# Patient Record
Sex: Female | Born: 1975 | Race: Black or African American | Hispanic: No | Marital: Single | Smoking: Never smoker
Health system: Southern US, Community
[De-identification: ages and names within clinical notes are randomized; demographics above are authoritative.]

## PROBLEM LIST (undated history)

## (undated) DIAGNOSIS — E079 Disorder of thyroid, unspecified: Secondary | ICD-10-CM

## (undated) DIAGNOSIS — I10 Essential (primary) hypertension: Secondary | ICD-10-CM

## (undated) DIAGNOSIS — E0590 Thyrotoxicosis, unspecified without thyrotoxic crisis or storm: Secondary | ICD-10-CM

## (undated) HISTORY — DX: Thyrotoxicosis, unspecified without thyrotoxic crisis or storm: E05.90

---

## 2012-10-31 ENCOUNTER — Encounter (HOSPITAL_BASED_OUTPATIENT_CLINIC_OR_DEPARTMENT_OTHER): Payer: Self-pay | Admitting: Emergency Medicine

## 2012-10-31 ENCOUNTER — Emergency Department (HOSPITAL_BASED_OUTPATIENT_CLINIC_OR_DEPARTMENT_OTHER)
Admission: EM | Admit: 2012-10-31 | Discharge: 2012-11-01 | Disposition: A | Discharge status: Home / Self Care | Payer: PRIVATE HEALTH INSURANCE | Attending: Emergency Medicine | Admitting: Emergency Medicine

## 2012-10-31 ENCOUNTER — Emergency Department (HOSPITAL_BASED_OUTPATIENT_CLINIC_OR_DEPARTMENT_OTHER): Payer: PRIVATE HEALTH INSURANCE

## 2012-10-31 DIAGNOSIS — B9689 Other specified bacterial agents as the cause of diseases classified elsewhere: Secondary | ICD-10-CM

## 2012-10-31 DIAGNOSIS — R109 Unspecified abdominal pain: Secondary | ICD-10-CM | POA: Insufficient documentation

## 2012-10-31 DIAGNOSIS — O2390 Unspecified genitourinary tract infection in pregnancy, unspecified trimester: Secondary | ICD-10-CM | POA: Insufficient documentation

## 2012-10-31 DIAGNOSIS — N898 Other specified noninflammatory disorders of vagina: Secondary | ICD-10-CM | POA: Insufficient documentation

## 2012-10-31 DIAGNOSIS — N760 Acute vaginitis: Secondary | ICD-10-CM | POA: Insufficient documentation

## 2012-10-31 DIAGNOSIS — O9989 Other specified diseases and conditions complicating pregnancy, childbirth and the puerperium: Secondary | ICD-10-CM | POA: Insufficient documentation

## 2012-10-31 DIAGNOSIS — O200 Threatened abortion: Secondary | ICD-10-CM | POA: Insufficient documentation

## 2012-10-31 LAB — WET PREP, GENITAL

## 2012-10-31 LAB — PREGNANCY, URINE: Preg Test, Ur: POSITIVE — AB

## 2012-10-31 LAB — URINALYSIS, ROUTINE W REFLEX MICROSCOPIC
Bilirubin Urine: NEGATIVE
Ketones, ur: 15 mg/dL — AB
Nitrite: NEGATIVE
Protein, ur: NEGATIVE mg/dL
Urobilinogen, UA: 1 mg/dL (ref 0.0–1.0)
pH: 6 (ref 5.0–8.0)

## 2012-10-31 LAB — RH IG WORKUP (INCLUDES ABO/RH): ABO/RH(D): O POS

## 2012-10-31 MED ORDER — METRONIDAZOLE 500 MG PO TABS: 500.0000 mg | ORAL_TABLET | Freq: Two times a day (BID) | ORAL | Status: DC

## 2012-10-31 NOTE — ED Provider Notes (Signed)
CSN: [id number]     Arrival date & time 10/31/12  2051 History   First MD Initiated Contact with Patient 10/31/12 2232     Chief Complaint  Patient presents with  . Vaginal Bleeding   (Consider location/radiation/quality/duration/timing/severity/associated sxs/prior Treatment) Patient is a 37 y.o. female presenting with vaginal bleeding. The history is provided by the patient.  Vaginal Bleeding Quality:  Bright red Severity:  Mild Onset quality:  Sudden Timing: once. Chronicity:  New Possible pregnancy: yes   Context: after urination   Relieved by:  None tried Associated symptoms: abdominal pain (left lower pain sincer earlier today) and vaginal discharge   Associated symptoms: no dizziness, no dysuria, no fever and no nausea   Risk factors: no hx of ectopic pregnancy, no new sexual partner and no STD exposure     History reviewed. No pertinent past medical history. History reviewed. No pertinent past surgical history. No family history on file. History  Substance Use Topics  . Smoking status: Never Smoker   . Smokeless tobacco: Not on file  . Alcohol Use: No   OB History   Grav Para Term Preterm Abortions TAB SAB Ect Mult Living   1              Review of Systems  Constitutional: Negative for fever.  Respiratory: Negative for shortness of breath.   Cardiovascular: Negative for chest pain.  Gastrointestinal: Positive for abdominal pain (left lower pain sincer earlier today). Negative for nausea and vomiting.  Genitourinary: Positive for vaginal bleeding and vaginal discharge. Negative for dysuria, hematuria and pelvic pain.  Neurological: Negative for dizziness and light-headedness.  All other systems reviewed and are negative.    Allergies  Review of patient's allergies indicates no known allergies.  Home Medications   Current Outpatient Rx  Name  Route  Sig  Dispense  Refill  . Prenatal Vit-Fe Fumarate-FA (PRENATAL MULTIVITAMIN) TABS tablet   Oral   Take 1  tablet by mouth daily at 12 noon.          BP 114/56  Pulse 75  Temp(Src) 97.7 F (36.5 C) (Oral)  Resp 16  Ht 5' 10.5" (1.791 m)  Wt 160 lb (72.576 kg)  BMI 22.63 kg/m2  SpO2 100%  LMP 07/22/2012 Physical Exam  Nursing note and vitals reviewed. Constitutional: She is oriented to person, place, and time. She appears well-developed and well-nourished.  HENT:  Head: Normocephalic and atraumatic.  Right Ear: External ear normal.  Left Ear: External ear normal.  Nose: Nose normal.  Eyes: Right eye exhibits no discharge. Left eye exhibits no discharge.  Cardiovascular: Normal rate, regular rhythm and normal heart sounds.   Pulmonary/Chest: Effort normal and breath sounds normal.  Abdominal: Soft. There is tenderness (mild) in the suprapubic area and left lower quadrant.  Genitourinary: Uterus is tender (mild). Cervix exhibits no motion tenderness. Vaginal discharge found.  Cervical os is closed  Neurological: She is alert and oriented to person, place, and time.  Skin: Skin is warm and dry.    ED Course  Procedures (including critical care time) Labs Review Labs Reviewed  WET PREP, GENITAL - Abnormal; Notable for the following:    Clue Cells Wet Prep HPF POC MODERATE (*)    WBC, Wet Prep HPF POC FEW (*)    All other components within normal limits  PREGNANCY, URINE - Abnormal; Notable for the following:    Preg Test, Ur POSITIVE (*)    All other components within normal limits  URINALYSIS,  ROUTINE W REFLEX MICROSCOPIC - Abnormal; Notable for the following:    Ketones, ur 15 (*)    All other components within normal limits  HCG, QUANTITATIVE, PREGNANCY - Abnormal; Notable for the following:    hCG, Beta Chain, Quant, S 29562 (*)    All other components within normal limits  GC/CHLAMYDIA PROBE AMP  RH IG WORKUP (INCLUDES ABO/RH)   Imaging Review US Ob Comp Less 14 Wks  10/31/2012   *RADIOLOGY REPORT*  Clinical Data: Cramping and spotting.  OBSTETRIC <14 WK ULTRASOUND   Technique:  Transabdominal ultrasound was performed for evaluation of the gestation as well as the maternal uterus and adnexal regions.  Comparison:  None.  Intrauterine gestational sac: Single and within normal. Yolk sac: Not visualized. Embryo: Within normal Cardiac Activity: Within normal Heart Rate: 136 bpm  Normal subjective amount of amniotic fluid volume.  CRL:  80.9 mm  14 w  zero d         [place] EDC: 05/01/2013  Maternal uterus/Adnexae: Ovaries not visualized.  No free fluid.  IMPRESSION: Single live IUP with estimated gestational age [redacted] weeks 0 days.   Original Report Authenticated By: Elberta Fortis, M.D.    MDM   1. Threatened abortion   2. BV (bacterial vaginosis)    37 year old female who is a G5P3 with some bleeding after wiping today after urination. She denies any other vaginal bleeding. She's had some mild lower abdominal discomfort. She is approximately [redacted] weeks pregnant by LMP. US shows viable fetus, no ectopic. Blood type is O+, no rhogam necessary. Will treat symptomatic BV with flagyl and recommend close f/u with OB.    Audree Camel, MD 10/31/12 [phone]

## 2012-10-31 NOTE — ED Notes (Signed)
MD at bedside. 

## 2012-10-31 NOTE — ED Notes (Signed)
Pt has not had any prenatal care up to this point

## 2012-10-31 NOTE — ED Notes (Signed)
Pt c/o abd cramping and spotting bright red blood, described as small amount by pt. Pt is [redacted] weeks pregnant.

## 2012-10-31 NOTE — ED Notes (Signed)
Patient transported to Ultrasound 

## 2012-11-02 LAB — GC/CHLAMYDIA PROBE AMP: GC Probe RNA: NEGATIVE

## 2014-01-07 ENCOUNTER — Encounter (HOSPITAL_BASED_OUTPATIENT_CLINIC_OR_DEPARTMENT_OTHER): Payer: Self-pay | Admitting: Emergency Medicine

## 2015-08-12 ENCOUNTER — Emergency Department (HOSPITAL_BASED_OUTPATIENT_CLINIC_OR_DEPARTMENT_OTHER): Payer: No Typology Code available for payment source

## 2015-08-12 ENCOUNTER — Encounter (HOSPITAL_BASED_OUTPATIENT_CLINIC_OR_DEPARTMENT_OTHER): Payer: Self-pay | Admitting: *Deleted

## 2015-08-12 ENCOUNTER — Emergency Department (HOSPITAL_BASED_OUTPATIENT_CLINIC_OR_DEPARTMENT_OTHER)
Admission: EM | Admit: 2015-08-12 | Discharge: 2015-08-12 | Disposition: A | Discharge status: Home / Self Care | Payer: No Typology Code available for payment source | Attending: Emergency Medicine | Admitting: Emergency Medicine

## 2015-08-12 DIAGNOSIS — Z79899 Other long term (current) drug therapy: Secondary | ICD-10-CM | POA: Diagnosis not present

## 2015-08-12 DIAGNOSIS — M545 Low back pain, unspecified: Secondary | ICD-10-CM

## 2015-08-12 DIAGNOSIS — M546 Pain in thoracic spine: Secondary | ICD-10-CM | POA: Diagnosis not present

## 2015-08-12 DIAGNOSIS — Y9389 Activity, other specified: Secondary | ICD-10-CM | POA: Insufficient documentation

## 2015-08-12 DIAGNOSIS — I10 Essential (primary) hypertension: Secondary | ICD-10-CM | POA: Diagnosis not present

## 2015-08-12 DIAGNOSIS — Y92410 Unspecified street and highway as the place of occurrence of the external cause: Secondary | ICD-10-CM | POA: Insufficient documentation

## 2015-08-12 DIAGNOSIS — Y999 Unspecified external cause status: Secondary | ICD-10-CM | POA: Insufficient documentation

## 2015-08-12 HISTORY — DX: Disorder of thyroid, unspecified: E07.9

## 2015-08-12 HISTORY — DX: Essential (primary) hypertension: I10

## 2015-08-12 LAB — PREGNANCY, URINE: Preg Test, Ur: NEGATIVE

## 2015-08-12 MED ORDER — NAPROXEN 250 MG PO TABS
500.0000 mg | ORAL_TABLET | Freq: Once | ORAL | Status: AC
  Administered 2015-08-12: 500 mg via ORAL
  Filled 2015-08-12: qty 2
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
## 2019-12-06 ENCOUNTER — Other Ambulatory Visit: Payer: Self-pay

## 2019-12-06 ENCOUNTER — Ambulatory Visit (INDEPENDENT_AMBULATORY_CARE_PROVIDER_SITE_OTHER): Payer: No Typology Code available for payment source | Admitting: Family Medicine

## 2019-12-06 ENCOUNTER — Other Ambulatory Visit (HOSPITAL_COMMUNITY)
Admission: RE | Admit: 2019-12-06 | Discharge: 2019-12-06 | Disposition: A | Discharge status: Home / Self Care | Payer: No Typology Code available for payment source | Source: Ambulatory Visit | Attending: Family Medicine | Admitting: Family Medicine

## 2019-12-06 ENCOUNTER — Encounter: Payer: Self-pay | Admitting: Family Medicine

## 2019-12-06 VITALS — BP 101/68 | HR 58 | Ht 70.0 in | Wt 130.0 lb

## 2019-12-06 DIAGNOSIS — Z1231 Encounter for screening mammogram for malignant neoplasm of breast: Secondary | ICD-10-CM | POA: Diagnosis not present

## 2019-12-06 DIAGNOSIS — Z01419 Encounter for gynecological examination (general) (routine) without abnormal findings: Secondary | ICD-10-CM

## 2019-12-06 DIAGNOSIS — Z113 Encounter for screening for infections with a predominantly sexual mode of transmission: Secondary | ICD-10-CM | POA: Diagnosis not present

## 2019-12-06 DIAGNOSIS — Z0000 Encounter for general adult medical examination without abnormal findings: Secondary | ICD-10-CM | POA: Diagnosis not present

## 2019-12-06 DIAGNOSIS — Z308 Encounter for other contraceptive management: Secondary | ICD-10-CM | POA: Diagnosis not present

## 2019-12-06 NOTE — Progress Notes (Signed)
GYNECOLOGY ANNUAL PREVENTATIVE CARE ENCOUNTER NOTE  Subjective:   Cassandra Harper is a 44 y.o. [phone] female here for a routine annual gynecologic exam.  Current complaints: none.   Denies abnormal vaginal bleeding, discharge, pelvic pain, problems with intercourse or other gynecologic concerns.    Gynecologic History Patient's last menstrual period was 10/21/2019. Patient is  sexually active  Contraception: condoms Last Pap: 3 years ago. Results were: normal  Obstetric History OB History  Gravida Para Term Preterm AB Living  7 4 4   3 4   SAB TAB Ectopic Multiple Live Births  1 2     4     # Outcome Date GA Lbr Len/2nd Weight Sex Delivery Anes PTL Lv  7 Term 45 [redacted]w[redacted]d   F Vag-Spont None N LIV  6 TAB 2014          5 Term 2007 [redacted]w[redacted]d   M Vag-Spont None N LIV  4 TAB 2006          3 Term 2002 [redacted]w[redacted]d   F Vag-Spont None N LIV  2 Term 1998 [redacted]w[redacted]d   F Vag-Spont EPI N LIV  1 SAB 1997            Past Medical History:  Diagnosis Date   Hypertension    Hyperthyroidism    Thyroid disease     History reviewed. No pertinent surgical history.  Current Outpatient Medications on File Prior to Visit  Medication Sig Dispense Refill   methimazole (TAPAZOLE) 10 MG tablet Take by mouth every other day. (Patient not taking: Reported on 12/06/2019)     PROPRANOLOL HCL PO Take by mouth every other day. (Patient not taking: Reported on 12/06/2019)     No current facility-administered medications on file prior to visit.    No Known Allergies  Social History   Socioeconomic History   Marital status: Single    Spouse name: Not on file   Number of children: Not on file   Years of education: Not on file   Highest education level: Not on file  Occupational History   Not on file  Tobacco Use   Smoking status: Never Smoker   Smokeless tobacco: Never Used  Substance and Sexual Activity   Alcohol use: No   Drug use: Never   Sexual activity: Yes    Birth control/protection:  None  Other Topics Concern   Not on file  Social History Narrative   Not on file   Social Determinants of Health   Financial Resource Strain:    Difficulty of Paying Living Expenses: Not on file  Food Insecurity:    Worried About Running Out of Food in the Last Year: Not on file   Ran Out of Food in the Last Year: Not on file  Transportation Needs:    Lack of Transportation (Medical): Not on file   Lack of Transportation (Non-Medical): Not on file  Physical Activity:    Days of Exercise per Week: Not on file   Minutes of Exercise per Session: Not on file  Stress:    Feeling of Stress : Not on file  Social Connections:    Frequency of Communication with Friends and Family: Not on file   Frequency of Social Gatherings with Friends and Family: Not on file   Attends Religious Services: Not on file   Active Member of Clubs or Organizations: Not on file   Attends 12/08/2019 Meetings: Not on file   Marital Status: Not on file  Intimate Partner Violence:    Fear of Current or Ex-Partner: Not on file   Emotionally Abused: Not on file   Physically Abused: Not on file   Sexually Abused: Not on file    Family History  Problem Relation Age of Onset   Cancer Father     The following portions of the patient's history were reviewed and updated as appropriate: allergies, current medications, past family history, past medical history, past social history, past surgical history and problem list.  Review of Systems Pertinent items are noted in HPI.   Objective:  BP 101/68    Pulse (!) 58    Ht 5\' 10"  (1.778 m)    Wt 130 lb (59 kg)    LMP 10/21/2019    BMI 18.65 kg/m  Wt Readings from Last 3 Encounters:  12/06/19 130 lb (59 kg)  08/12/15 167 lb (75.8 kg)  10/31/12 160 lb (72.6 kg)     Chaperone present during exam  CONSTITUTIONAL: Well-developed, well-nourished female in no acute distress.  HENT:  Normocephalic, atraumatic, External right and left  ear normal. Oropharynx is clear and moist EYES: Conjunctivae and EOM are normal. Pupils are equal, round, and reactive to light. No scleral icterus.  NECK: Normal range of motion, supple, no masses.  Normal thyroid.   CARDIOVASCULAR: Normal heart rate noted, regular rhythm RESPIRATORY: Clear to auscultation bilaterally. Effort and breath sounds normal, no problems with respiration noted. BREASTS: Symmetric in size. No masses, skin changes, nipple drainage, or lymphadenopathy. ABDOMEN: Soft, normal bowel sounds, no distention noted.  No tenderness, rebound or guarding.  PELVIC: Normal appearing external genitalia; normal appearing vaginal mucosa and cervix.  No abnormal discharge noted.   MUSCULOSKELETAL: Normal range of motion. No tenderness.  No cyanosis, clubbing, or edema.  2+ distal pulses. SKIN: Skin is warm and dry. No rash noted. Not diaphoretic. No erythema. No pallor. NEUROLOGIC: Alert and oriented to person, place, and time. Normal reflexes, muscle tone coordination. No cranial nerve deficit noted. PSYCHIATRIC: Normal mood and affect. Normal behavior. Normal judgment and thought content.  Assessment:  Annual gynecologic examination with pap smear   Plan:  1. Well Woman Exam Will follow up results of pap smear and manage accordingly. Mammogram scheduled STD testing discussed. Patient requested testing - Cytology - PAP( [place]) - RPR - Hepatitis B Surface AntiGEN - Hepatitis C Antibody - HIV antibody (with reflex)  2. Encounter for screening mammogram for malignant neoplasm of breast - MM DIGITAL SCREENING BILATERAL; Future  3. Screening for STD (sexually transmitted disease) - [facility]PR - Hepatitis B Surface AntiGEN - Hepatitis C Antibody - HIV antibody (with reflex)   Routine preventative health maintenance measures emphasized. Please refer to After Visit Summary for other counseling recommendations.    11/02/12, DO [facility]

## 2019-12-07 LAB — HEPATITIS B SURFACE ANTIGEN: Hepatitis B Surface Ag: NEGATIVE

## 2019-12-07 LAB — RPR: RPR Ser Ql: NONREACTIVE

## 2019-12-07 LAB — HIV ANTIBODY (ROUTINE TESTING W REFLEX): HIV Screen 4th Generation wRfx: NONREACTIVE

## 2019-12-07 LAB — HEPATITIS C ANTIBODY: Hep C Virus Ab: <0.1 s/co ratio (ref 0.0–0.9)

## 2019-12-11 LAB — CYTOLOGY - PAP
Chlamydia: NEGATIVE
Comment: NEGATIVE
Comment: NEGATIVE
Comment: NORMAL
Diagnosis: NEGATIVE
High risk HPV: NEGATIVE
Neisseria Gonorrhea: NEGATIVE

## 2020-01-08 ENCOUNTER — Ambulatory Visit (HOSPITAL_BASED_OUTPATIENT_CLINIC_OR_DEPARTMENT_OTHER)
Admission: RE | Admit: 2020-01-08 | Discharge: 2020-01-08 | Disposition: A | Discharge status: Home / Self Care | Payer: No Typology Code available for payment source | Source: Ambulatory Visit | Attending: Family Medicine | Admitting: Family Medicine

## 2020-01-08 ENCOUNTER — Other Ambulatory Visit: Payer: Self-pay

## 2020-01-08 ENCOUNTER — Ambulatory Visit (INDEPENDENT_AMBULATORY_CARE_PROVIDER_SITE_OTHER): Payer: No Typology Code available for payment source | Admitting: Medical

## 2020-01-08 ENCOUNTER — Encounter: Payer: Self-pay | Admitting: Medical

## 2020-01-08 ENCOUNTER — Encounter (HOSPITAL_BASED_OUTPATIENT_CLINIC_OR_DEPARTMENT_OTHER): Payer: Self-pay

## 2020-01-08 VITALS — BP 104/69 | HR 57 | Resp 18 | Ht 69.0 in | Wt 129.6 lb

## 2020-01-08 DIAGNOSIS — R634 Abnormal weight loss: Secondary | ICD-10-CM

## 2020-01-08 DIAGNOSIS — R630 Anorexia: Secondary | ICD-10-CM | POA: Diagnosis not present

## 2020-01-08 DIAGNOSIS — Z0000 Encounter for general adult medical examination without abnormal findings: Secondary | ICD-10-CM

## 2020-01-08 DIAGNOSIS — Z1231 Encounter for screening mammogram for malignant neoplasm of breast: Secondary | ICD-10-CM | POA: Insufficient documentation

## 2020-01-08 DIAGNOSIS — Z0001 Encounter for general adult medical examination with abnormal findings: Secondary | ICD-10-CM

## 2020-01-08 DIAGNOSIS — R350 Frequency of micturition: Secondary | ICD-10-CM

## 2020-01-08 DIAGNOSIS — Z8639 Personal history of other endocrine, nutritional and metabolic disease: Secondary | ICD-10-CM

## 2020-01-08 DIAGNOSIS — F32A Depression, unspecified: Secondary | ICD-10-CM

## 2020-01-08 MED ORDER — SERTRALINE HCL 25 MG PO TABS: 25.0000 mg | ORAL_TABLET | Freq: Every day | ORAL | 0 refills | Status: DC

## 2020-01-08 NOTE — Patient Instructions (Addendum)
For you wellness exam today I have ordered cbc, cmp and lipid.  Check tsh and t4 on day of lab work.  For weight loss follow mammogram. May refer you to gi but want you do stool cards for blood test and return those to our office.  For depression rx sertraline. Will see if you gain weight on med. May refer to [facility] for early screening colon ca.  May consider ct scan abd pelvis on follow up.  Get ua poct when not on menses.  Declined flu vaccine.  Recommend exercise and healthy diet.  We will let you know lab results as they come in.  Follow up date appointment will be determined after lab review.    Preventive Care 20-87 Years Old, Female Preventive care refers to visits with your health care provider and lifestyle choices that can promote health and wellness. This includes:  A yearly physical exam. This may also be called an annual well check.  Regular dental visits and eye exams.  Immunizations.  Screening for certain conditions.  Healthy lifestyle choices, such as eating a healthy diet, getting regular exercise, not using drugs or products that contain nicotine and tobacco, and limiting alcohol use. What can I expect for my preventive care visit? Physical exam Your health care provider will check your:  Height and weight. This may be used to calculate body mass index (BMI), which tells if you are at a healthy weight.  Heart rate and blood pressure.  Skin for abnormal spots. Counseling Your health care provider may ask you questions about your:  Alcohol, tobacco, and drug use.  Emotional well-being.  Home and relationship well-being.  Sexual activity.  Eating habits.  Work and work Statistician.  Method of birth control.  Menstrual cycle.  Pregnancy history. What immunizations do I need?  Influenza (flu) vaccine  This is recommended every year. Tetanus, diphtheria, and pertussis (Tdap) vaccine  You may need a Td booster every 10 years. Varicella  (chickenpox) vaccine  You may need this if you have not been vaccinated. Zoster (shingles) vaccine  You may need this after age 89. Measles, mumps, and rubella (MMR) vaccine  You may need at least one dose of MMR if you were born in 1957 or later. You may also need a second dose. Pneumococcal conjugate (PCV13) vaccine  You may need this if you have certain conditions and were not previously vaccinated. Pneumococcal polysaccharide (PPSV23) vaccine  You may need one or two doses if you smoke cigarettes or if you have certain conditions. Meningococcal conjugate (MenACWY) vaccine  You may need this if you have certain conditions. Hepatitis A vaccine  You may need this if you have certain conditions or if you travel or work in places where you may be exposed to hepatitis A. Hepatitis B vaccine  You may need this if you have certain conditions or if you travel or work in places where you may be exposed to hepatitis B. Haemophilus influenzae type b (Hib) vaccine  You may need this if you have certain conditions. Human papillomavirus (HPV) vaccine  If recommended by your health care provider, you may need three doses over 6 months. You may receive vaccines as individual doses or as more than one vaccine together in one shot (combination vaccines). Talk with your health care provider about the risks and benefits of combination vaccines. What tests do I need? Blood tests  Lipid and cholesterol levels. These may be checked every 5 years, or more frequently if you are  over 98 years old.  Hepatitis C test.  Hepatitis B test. Screening  Lung cancer screening. You may have this screening every year starting at age 38 if you have a 30-pack-year history of smoking and currently smoke or have quit within the past 15 years.  Colorectal cancer screening. All adults should have this screening starting at age 56 and continuing until age 51. Your health care provider may recommend screening at  age 50 if you are at increased risk. You will have tests every 1-10 years, depending on your results and the type of screening test.  Diabetes screening. This is done by checking your blood sugar (glucose) after you have not eaten for a while (fasting). You may have this done every 1-3 years.  Mammogram. This may be done every 1-2 years. Talk with your health care provider about when you should start having regular mammograms. This may depend on whether you have a family history of breast cancer.  BRCA-related cancer screening. This may be done if you have a family history of breast, ovarian, tubal, or peritoneal cancers.  Pelvic exam and Pap test. This may be done every 3 years starting at age 57. Starting at age 15, this may be done every 5 years if you have a Pap test in combination with an HPV test. Other tests  Sexually transmitted disease (STD) testing.  Bone density scan. This is done to screen for osteoporosis. You may have this scan if you are at high risk for osteoporosis. Follow these instructions at home: Eating and drinking  Eat a diet that includes fresh fruits and vegetables, whole grains, lean protein, and low-fat dairy.  Take vitamin and mineral supplements as recommended by your health care provider.  Do not drink alcohol if: ? Your health care provider tells you not to drink. ? You are pregnant, may be pregnant, or are planning to become pregnant.  If you drink alcohol: ? Limit how much you have to 0-1 drink a day. ? Be aware of how much alcohol is in your drink. In the [place] one drink equals one 12 oz bottle of beer (355 mL), one 5 oz glass of wine (148 mL), or one 1 oz glass of hard liquor (44 mL). Lifestyle  Take daily care of your teeth and gums.  Stay active. Exercise for at least 30 minutes on 5 or more days each week.  Do not use any products that contain nicotine or tobacco, such as cigarettes, e-cigarettes, and chewing tobacco. If you need help quitting,  ask your health care provider.  If you are sexually active, practice safe sex. Use a condom or other form of birth control (contraception) in order to prevent pregnancy and STIs (sexually transmitted infections).  If told by your health care provider, take low-dose aspirin daily starting at age 53. What's next?  Visit your health care provider once a year for a well check visit.  Ask your health care provider how often you should have your eyes and teeth checked.  Stay up to date on all vaccines. This information is not intended to replace advice given to you by your health care provider. Make sure you discuss any questions you have with your health care provider. Document Revised: 11/03/2017 Document Reviewed: 11/03/2017 Elsevier Patient Education  2020 [employer].

## 2020-01-08 NOTE — Progress Notes (Addendum)
Subjective:    Patient ID: Cassandra Harper, female    DOB: [birth year], 44 y.o.   MRN: [id number]  HPI  Pt in for first time.  Pt was seen in past [facility]. Pt does not exercise regularly. She like to walk. Pt is member of [employer]. Nonsmoker. Used to smoke but quit 10 years ago. No alcohol.  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   Pt thinks not eating healthy. Eats small amounts.   Pt has history of hyperthyroidism. Pt used to be on methimazole and propanolol. Pt being followed endocrinologist. Pt seeing [facility].  Pt states endocrinologist told her blood sugar was low and follow up with pcp.  Pt weights 129. She used to weight 160.  No family history of colon cancer of polyps.  Pt pap smear was normal in September.  Pt 4 yr old daughter passed away 2 years ago. Pt denies obvious depression but phq-9 score 11. lmp- today/this morning.  Pt got mammogram    Review of Systems  Constitutional: Positive for unexpected weight change. Negative for fatigue and fever.  Respiratory: Negative for chest tightness, shortness of breath and wheezing.   Cardiovascular: Negative for chest pain and palpitations.  Gastrointestinal: Negative for abdominal pain and blood in stool.  Genitourinary: Positive for frequency. Negative for dysuria, enuresis, flank pain and urgency.       On cycle.  Musculoskeletal: Negative for back pain, joint swelling and neck pain.  Skin: Negative for rash.  Neurological: Negative for dizziness, seizures, syncope, speech difficulty, weakness and light-headedness.  Hematological: Negative for adenopathy. Does not bruise/bleed easily.  Psychiatric/Behavioral: Positive for dysphoric mood. Negative for sleep disturbance and suicidal ideas. The patient is not nervous/anxious and is not hyperactive.        Phq-9 score.     Past Medical History:  Diagnosis Date  . Hypertension   . Hyperthyroidism   .  Thyroid disease      Social History   Socioeconomic History  . Marital status: Single    Spouse name: Not on file  . Number of children: Not on file  . Years of education: Not on file  . Highest education level: Not on file  Occupational History  . Not on file  Tobacco Use  . Smoking status: Never Smoker  . Smokeless tobacco: Never Used  Substance and Sexual Activity  . Alcohol use: No  . Drug use: Never  . Sexual activity: Yes    Birth control/protection: None  Other Topics Concern  . Not on file  Social History Narrative  . Not on file   Social Determinants of Health   Financial Resource Strain:   . Difficulty of Paying Living Expenses: Not on file  Food Insecurity:   . Worried About Programme researcher, broadcasting/film/video in the Last Year: Not on file  . Ran Out of Food in the Last Year: Not on file  Transportation Needs:   . Lack of Transportation (Medical): Not on file  . Lack of Transportation (Non-Medical): Not on file  Physical Activity:   . Days of Exercise per Week: Not on file  . Minutes of Exercise per Session: Not on file  Stress:   . Feeling of Stress : Not on file  Social Connections:   . Frequency of Communication with Friends and Family: Not on file  . Frequency of Social Gatherings with Friends and Family: Not on file  . Attends Religious Services:  Not on file  . Active Member of Clubs or Organizations: Not on file  . Attends Banker Meetings: Not on file  . Marital Status: Not on file  Intimate Partner Violence:   . Fear of Current or Ex-Partner: Not on file  . Emotionally Abused: Not on file  . Physically Abused: Not on file  . Sexually Abused: Not on file    History reviewed. No pertinent surgical history.  Family History  Problem Relation Age of Onset  . Cancer Father     No Known Allergies  Current Outpatient Medications on File Prior to Visit  Medication Sig Dispense Refill  . methimazole (TAPAZOLE) 10 MG tablet Take by mouth every  other day. (Patient not taking: Reported on 12/06/2019)    . PROPRANOLOL HCL PO Take by mouth every other day. (Patient not taking: Reported on 12/06/2019)     No current facility-administered medications on file prior to visit.    BP 104/69   Pulse (!) 57   Resp 18   Ht 5\' 9"  (1.753 m)   Wt 129 lb 9.6 oz (58.8 kg)   LMP 01/08/2020   SpO2 100%   BMI 19.14 kg/m        Objective:   Physical Exam  General Mental Status- Alert. General Appearance- Not in acute distress.   Skin General: Color- Normal Color. Moisture- Normal Moisture.  Neck Carotid Arteries- Normal color. Moisture- Normal Moisture. No carotid bruits. No JVD.  Chest and Lung Exam Auscultation: Breath Sounds:-Normal.  Cardiovascular Auscultation:Rythm- Regular. Murmurs & Other Heart Sounds:Auscultation of the heart reveals- No Murmurs.  Abdomen Inspection:-Inspeection Normal. Palpation/Percussion:Note:No mass. Palpation and Percussion of the abdomen reveal- Non Tender, Non Distended + BS, no rebound or guarding.    Neurologic Cranial Nerve exam:- CN III-XII intact(No nystagmus), symmetric smile. Strength:- 5/5 equal and symmetric strength both upper and lower extremities.      Assessment & Plan:  For you wellness exam today I have ordered cbc, cmp and lipid  For weight loss follow mammogram. May refer you to gi but want you do stool cards for blood test and return those to our office.  For depression rx sertraline. Will see if you gain weight on med. May refer to [facility] for early screening colon ca.  May consider ct scan abd pelvis on follow up.  Get ua poct when not on menses.  Declined flu vaccine.  Recommend exercise and healthy diet.  .  We will let you know lab results as they come in.  Follow up date appointment will be determined after lab review.   [facility], PA-C

## 2020-01-18 ENCOUNTER — Other Ambulatory Visit (INDEPENDENT_AMBULATORY_CARE_PROVIDER_SITE_OTHER): Payer: No Typology Code available for payment source

## 2020-01-18 DIAGNOSIS — R634 Abnormal weight loss: Secondary | ICD-10-CM

## 2020-01-18 LAB — FECAL OCCULT BLOOD, IMMUNOCHEMICAL: Fecal Occult Bld: NEGATIVE

## 2020-01-30 ENCOUNTER — Other Ambulatory Visit: Payer: Self-pay | Admitting: Medical

## 2020-09-25 ENCOUNTER — Telehealth: Payer: No Typology Code available for payment source | Admitting: Physician Assistant

## 2020-09-25 ENCOUNTER — Encounter (HOSPITAL_BASED_OUTPATIENT_CLINIC_OR_DEPARTMENT_OTHER): Payer: Self-pay

## 2020-09-25 ENCOUNTER — Other Ambulatory Visit: Payer: Self-pay

## 2020-09-25 ENCOUNTER — Emergency Department (HOSPITAL_BASED_OUTPATIENT_CLINIC_OR_DEPARTMENT_OTHER)
Admission: EM | Admit: 2020-09-25 | Discharge: 2020-09-25 | Disposition: A | Discharge status: Home / Self Care | Payer: No Typology Code available for payment source | Attending: Emergency Medicine | Admitting: Emergency Medicine

## 2020-09-25 DIAGNOSIS — E860 Dehydration: Secondary | ICD-10-CM

## 2020-09-25 DIAGNOSIS — R110 Nausea: Secondary | ICD-10-CM | POA: Diagnosis not present

## 2020-09-25 DIAGNOSIS — B9689 Other specified bacterial agents as the cause of diseases classified elsewhere: Secondary | ICD-10-CM | POA: Diagnosis not present

## 2020-09-25 DIAGNOSIS — J069 Acute upper respiratory infection, unspecified: Secondary | ICD-10-CM

## 2020-09-25 DIAGNOSIS — E039 Hypothyroidism, unspecified: Secondary | ICD-10-CM | POA: Insufficient documentation

## 2020-09-25 DIAGNOSIS — Z79899 Other long term (current) drug therapy: Secondary | ICD-10-CM | POA: Diagnosis not present

## 2020-09-25 DIAGNOSIS — I10 Essential (primary) hypertension: Secondary | ICD-10-CM | POA: Diagnosis not present

## 2020-09-25 DIAGNOSIS — R519 Headache, unspecified: Secondary | ICD-10-CM | POA: Insufficient documentation

## 2020-09-25 DIAGNOSIS — R1110 Vomiting, unspecified: Secondary | ICD-10-CM | POA: Insufficient documentation

## 2020-09-25 LAB — CBC WITH DIFFERENTIAL/PLATELET
Abs Immature Granulocytes: 0 10*3/uL (ref 0.00–0.07)
Basophils Absolute: 0 10*3/uL (ref 0.0–0.1)
Basophils Relative: 0 %
Eosinophils Absolute: 0 10*3/uL (ref 0.0–0.5)
Eosinophils Relative: 1 %
HCT: 46.1 % — ABNORMAL HIGH (ref 36.0–46.0)
Hemoglobin: 16 g/dL — ABNORMAL HIGH (ref 12.0–15.0)
Immature Granulocytes: 0 %
Lymphocytes Relative: 37 %
Lymphs Abs: 1.4 10*3/uL (ref 0.7–4.0)
MCH: 30.8 pg (ref 26.0–34.0)
MCHC: 34.7 g/dL (ref 30.0–36.0)
MCV: 88.7 fL (ref 80.0–100.0)
Monocytes Absolute: 0.5 10*3/uL (ref 0.1–1.0)
Monocytes Relative: 12 %
Neutro Abs: 2 10*3/uL (ref 1.7–7.7)
Neutrophils Relative %: 50 %
Platelets: 286 10*3/uL (ref 150–400)
RBC: 5.2 MIL/uL — ABNORMAL HIGH (ref 3.87–5.11)
RDW: 11.6 % (ref 11.5–15.5)
WBC: 3.9 10*3/uL — ABNORMAL LOW (ref 4.0–10.5)
nRBC: 0 % (ref 0.0–0.2)

## 2020-09-25 LAB — URINALYSIS, ROUTINE W REFLEX MICROSCOPIC
Bilirubin Urine: NEGATIVE
Glucose, UA: NEGATIVE mg/dL
Hgb urine dipstick: NEGATIVE
Ketones, ur: NEGATIVE mg/dL
Leukocytes,Ua: NEGATIVE
Nitrite: NEGATIVE
Protein, ur: NEGATIVE mg/dL
Specific Gravity, Urine: 1.008 (ref 1.005–1.030)
pH: 6.5 (ref 5.0–8.0)

## 2020-09-25 LAB — COMPREHENSIVE METABOLIC PANEL
ALT: 11 U/L (ref 0–44)
AST: 13 U/L — ABNORMAL LOW (ref 15–41)
Albumin: 4.9 g/dL (ref 3.5–5.0)
Alkaline Phosphatase: 45 U/L (ref 38–126)
Anion gap: 9 (ref 5–15)
BUN: 11 mg/dL (ref 6–20)
CO2: 27 mmol/L (ref 22–32)
Calcium: 9.9 mg/dL (ref 8.9–10.3)
Chloride: 103 mmol/L (ref 98–111)
Creatinine, Ser: 0.81 mg/dL (ref 0.44–1.00)
GFR, Estimated: >60 mL/min (ref >60)
Glucose, Bld: 96 mg/dL (ref 70–99)
Potassium: 3.8 mmol/L (ref 3.5–5.1)
Sodium: 139 mmol/L (ref 135–145)
Total Bilirubin: 0.9 mg/dL (ref 0.3–1.2)
Total Protein: 7.7 g/dL (ref 6.5–8.1)

## 2020-09-25 LAB — PREGNANCY, URINE: Preg Test, Ur: NEGATIVE

## 2020-09-25 MED ORDER — LACTATED RINGERS IV BOLUS
1000.0000 mL | Freq: Once | INTRAVENOUS | Status: AC
  Administered 2020-09-25: 1000 mL via INTRAVENOUS

## 2020-09-25 MED ORDER — AZITHROMYCIN 250 MG PO TABS: ORAL_TABLET | ORAL | 0 refills | Status: AC

## 2020-09-25 MED ORDER — METOCLOPRAMIDE HCL 10 MG PO TABS: 10.0000 mg | ORAL_TABLET | Freq: Four times a day (QID) | ORAL | 0 refills | Status: AC | PRN

## 2020-09-25 MED ORDER — KETOROLAC TROMETHAMINE 15 MG/ML IJ SOLN
15.0000 mg | Freq: Once | INTRAMUSCULAR | Status: AC
  Administered 2020-09-25: 15 mg via INTRAVENOUS
  Filled 2020-09-25: qty 1

## 2020-09-25 MED ORDER — METOCLOPRAMIDE HCL 5 MG/ML IJ SOLN
10.0000 mg | Freq: Once | INTRAMUSCULAR | Status: AC
  Administered 2020-09-25: 10 mg via INTRAVENOUS
  Filled 2020-09-25: qty 2

## 2020-09-25 MED ORDER — ONDANSETRON 4 MG PO TBDP: 4.0000 mg | ORAL_TABLET | Freq: Three times a day (TID) | ORAL | 0 refills | Status: AC | PRN

## 2020-09-25 MED ORDER — DIPHENHYDRAMINE HCL 50 MG/ML IJ SOLN
25.0000 mg | Freq: Once | INTRAMUSCULAR | Status: AC
  Administered 2020-09-25: 25 mg via INTRAVENOUS
  Filled 2020-09-25: qty 1

## 2020-09-25 NOTE — Patient Instructions (Signed)

## 2020-09-25 NOTE — ED Provider Notes (Signed)
[facility] EMERGENCY DEPT Provider Note   CSN: [id number] Arrival date & time: 09/25/20  8657     History Chief Complaint  Patient presents with   Migraine   Fatigue    Cassandra Harper is a 45 y.o. female.  HPI 45 year old female presents with a chief complaint of headache, vomiting, and dehydration.  She was encouraged to come to the ER because she is dehydrated by her PCP.  She tested positive on 7/11 for COVID.  Her only symptoms at that time were chills and fatigue.  On 7/18 she felt better.  However on 7/19 she developed headache that has been waxing and waning and is left-sided and behind her left eye.  Moving and looking to the left makes her headache worse.  She is tried some Tylenol PM.  She has not had a fever but has been having vomiting since the headache started.  No weakness or numbness.  No diarrhea or abdominal pain.  No neck stiffness.  She has had headaches before but not like this.  Her vision is okay except for the fact that it hurts to look to the left.  Past Medical History:  Diagnosis Date   Hypertension    Hyperthyroidism    Thyroid disease     There are no problems to display for this patient.   History reviewed. No pertinent surgical history.   OB History     Gravida  7   Para  4   Term  4   Preterm      AB  3   Living  4      SAB  1   IAB  2   Ectopic      Multiple      Live Births  4           Family History  Problem Relation Age of Onset   Cancer Father     Social History   Tobacco Use   Smoking status: Never   Smokeless tobacco: Never  Substance Use Topics   Alcohol use: No   Drug use: Never    Home Medications Prior to Admission medications   Medication Sig Start Date End Date Taking? Authorizing Provider  metoCLOPramide (REGLAN) 10 MG tablet Take 1 tablet (10 mg total) by mouth every 6 (six) hours as needed for nausea (nausea/headache). 09/25/20  Yes Pricilla Loveless, MD  azithromycin  (ZITHROMAX) 250 MG tablet Take 2 tablets PO on day one, and one tablet PO daily thereafter until completed. 09/25/20   Margaretann Loveless, PA-C  methimazole (TAPAZOLE) 10 MG tablet Take by mouth every other day. Patient not taking: Reported on 12/06/2019    [provider]  ondansetron (ZOFRAN ODT) 4 MG disintegrating tablet Take 1 tablet (4 mg total) by mouth every 8 (eight) hours as needed for nausea or vomiting. 09/25/20   Margaretann Loveless, PA-C  PROPRANOLOL HCL PO Take by mouth every other day. Patient not taking: Reported on 12/06/2019    [provider]  sertraline (ZOLOFT) 25 MG tablet TAKE 1 TABLET BY MOUTH EVERY DAY 01/30/20   Saguier, Ramon Dredge, PA-C    Allergies    Patient has no known allergies.  Review of Systems   Review of Systems  Constitutional:  Negative for fever.  Eyes:  Positive for photophobia and pain. Negative for visual disturbance.  Respiratory:  Negative for shortness of breath.   Cardiovascular:  Negative for chest pain.  Gastrointestinal:  Positive for nausea and vomiting. Negative  for abdominal pain.  Musculoskeletal:  Negative for neck pain.  Neurological:  Positive for headaches. Negative for weakness and numbness.  All other systems reviewed and are negative.  Physical Exam Updated Vital Signs BP 111/71   Pulse (!) 53   Temp 98.6 F (37 C) (Oral)   Resp 16   Ht 5\' 9"  (1.753 m)   Wt 57.5 kg   LMP 09/05/2020 (Exact Date)   SpO2 100%   BMI 18.73 kg/m   Physical Exam Vitals and nursing note reviewed.  Constitutional:      General: She is not in acute distress.    Appearance: She is well-developed. She is not ill-appearing or diaphoretic.  HENT:     Head: Normocephalic and atraumatic.     Right Ear: External ear normal.     Left Ear: External ear normal.     Nose: Nose normal.  Eyes:     General:        Right eye: No discharge.        Left eye: No discharge.     Extraocular Movements: Extraocular movements intact.      Pupils: Pupils are equal, round, and reactive to light.     Comments: Visual fields intact  Cardiovascular:     Rate and Rhythm: Normal rate and regular rhythm.     Heart sounds: Normal heart sounds.  Pulmonary:     Effort: Pulmonary effort is normal.     Breath sounds: Normal breath sounds.  Abdominal:     Palpations: Abdomen is soft.     Tenderness: There is no abdominal tenderness.  Musculoskeletal:     Cervical back: Normal range of motion and neck supple. No rigidity.  Skin:    General: Skin is warm and dry.  Neurological:     Mental Status: She is alert.     Comments: CN 3-12 grossly intact. 5/5 strength in all 4 extremities. Grossly normal sensation. Normal finger to nose.   Psychiatric:        Mood and Affect: Mood is not anxious.    ED Results / Procedures / Treatments   Labs (all labs ordered are listed, but only abnormal results are displayed) Labs Reviewed  URINALYSIS, ROUTINE W REFLEX MICROSCOPIC - Abnormal; Notable for the following components:      Result Value   Color, Urine COLORLESS (*)    All other components within normal limits  CBC WITH DIFFERENTIAL/PLATELET - Abnormal; Notable for the following components:   WBC 3.9 (*)    RBC 5.20 (*)    Hemoglobin 16.0 (*)    HCT 46.1 (*)    All other components within normal limits  COMPREHENSIVE METABOLIC PANEL - Abnormal; Notable for the following components:   AST 13 (*)    All other components within normal limits  PREGNANCY, URINE    EKG EKG Interpretation  Date/Time:  Thursday September 25 2020 09:18:09 EDT Ventricular Rate:  57 PR Interval:  158 QRS Duration: 77 QT Interval:  424 QTC Calculation: 413 R Axis:   80 Text Interpretation: Sinus rhythm ST elev, probable normal early repol pattern No old tracing to compare Confirmed by [id number] [phone]) on 09/25/2020 9:28:48 AM  Radiology No results found.  Procedures Procedures   Medications Ordered in ED Medications  lactated ringers bolus  1,000 mL (0 mLs Intravenous Stopped 09/25/20 1033)  metoCLOPramide (REGLAN) injection 10 mg (10 mg Intravenous Given 09/25/20 0916)  diphenhydrAMINE (BENADRYL) injection 25 mg (25 mg Intravenous Given 09/25/20 0915)  ketorolac (TORADOL) 15 MG/ML injection 15 mg (15 mg Intravenous Given 09/25/20 1025)    ED Course  I have reviewed the triage vital signs and the nursing notes.  Pertinent labs & imaging results that were available during my care of the patient were reviewed by me and considered in my medical decision making (see chart for details).    MDM Rules/Calculators/A&P                           From the sound of her presentation it sounds like she is having a migraine though she does not have a typical history of migraines.  Unclear if this is related to her recent COVID infection.  With treatment she is feeling significantly better and feels well enough for discharge.  The headache has been waxing and waning and so my suspicion of acute bleeding such as subarachnoid bleeding is pretty low.  Her neuro exam is otherwise unremarkable.  Highly doubt infectious etiology.  I do not think CT or LP or MRI is needed.  If symptoms worsen she is encouraged return but she otherwise appears stable for discharge home. Final Clinical Impression(s) / ED Diagnoses Final diagnoses:  Left-sided headache    Rx / DC Orders ED Discharge Orders          Ordered    metoCLOPramide (REGLAN) 10 MG tablet  Every 6 hours PRN        09/25/20 1102             Pricilla Loveless, MD 09/25/20 1140

## 2020-09-25 NOTE — Discharge Instructions (Signed)
If you develop continued, recurrent, or worsening headache, fever, neck stiffness, vomiting, blurry or double vision, weakness or numbness in your arms or legs, trouble speaking, or any other new/concerning symptoms then return to the ER for evaluation.  

## 2020-09-25 NOTE — Progress Notes (Signed)
Ms. Cassandra, Harper are scheduled for a virtual visit with your provider today.    Just as we do with appointments in the office, we must obtain your consent to participate.  Your consent will be active for this visit and any virtual visit you may have with one of our providers in the next 365 days.    If you have a MyChart account, I can also send a copy of this consent to you electronically.  All virtual visits are billed to your insurance company just like a traditional visit in the office.  As this is a virtual visit, video technology does not allow for your provider to perform a traditional examination.  This may limit your provider's ability to fully assess your condition.  If your provider identifies any concerns that need to be evaluated in person or the need to arrange testing such as labs, EKG, etc, we will make arrangements to do so.    Although advances in technology are sophisticated, we cannot ensure that it will always work on either your end or our end.  If the connection with a video visit is poor, we may have to switch to a telephone visit.  With either a video or telephone visit, we are not always able to ensure that we have a secure connection.   I need to obtain your verbal consent now.   Are you willing to proceed with your visit today?   Cassandra Harper has provided verbal consent on 09/25/2020 for a virtual visit (video or telephone).   Cassandra Loveless, PA-C 09/25/2020  7:59 AM  Virtual Visit Consent   Cassandra Harper, you are scheduled for a virtual visit with a [facility] provider today.     Just as with appointments in the office, your consent must be obtained to participate.  Your consent will be active for this visit and any virtual visit you may have with one of our providers in the next 365 days.     If you have a MyChart account, a copy of this consent can be sent to you electronically.  All virtual visits are billed to your insurance company just like a traditional visit in  the office.    As this is a virtual visit, video technology does not allow for your provider to perform a traditional examination.  This may limit your provider's ability to fully assess your condition.  If your provider identifies any concerns that need to be evaluated in person or the need to arrange testing (such as labs, EKG, etc.), we will make arrangements to do so.     Although advances in technology are sophisticated, we cannot ensure that it will always work on either your end or our end.  If the connection with a video visit is poor, the visit may have to be switched to a telephone visit.  With either a video or telephone visit, we are not always able to ensure that we have a secure connection.     I need to obtain your verbal consent now.   Are you willing to proceed with your visit today?    Cassandra Harper has provided verbal consent on 09/25/2020 for a virtual visit (video or telephone).   Cassandra Loveless, PA-C   Date: 09/25/2020 7:59 AM   Virtual Visit via Video Note   I, Cassandra Harper, connected with  Cassandra Harper  ([id number], 09-23-1975) on 09/25/20 at  7:30 AM EDT by a video-enabled telemedicine application and verified that I  am speaking with the correct person using two identifiers.  Location: Patient: Virtual Visit Location Patient: Home Provider: Virtual Visit Location Provider: Home Office   I discussed the limitations of evaluation and management by telemedicine and the availability of in person appointments. The patient expressed understanding and agreed to proceed.    History of Present Illness: Cassandra Harper is a 45 y.o. who identifies as a female who was assigned female at birth, and is being seen today for worsening upper respiratory symptoms.   Back on 09/02/20, she had to undergo replacement of her lower teeth implants. For about a week her intake was decreased. Then on 09/15/20 she tested positive for Covid 19. She has been isolating and actually reports  complete symptom resolution. Then starting yesterday, 09/24/20, she developed headache, sinus congestion, nausea, food taste off, chills, and has decreased her intake due to severe nausea and vomiting. She reports she is feeling weak. Notes she has not urinated this morning, but last went about 1am. Not sleeping well. Up all last night with body aches and chills. Denies shortness of breath, difficulty breathing, dizziness, lightheadedness.   Problems: There are no problems to display for this patient.   Allergies: No Known Allergies Medications:  Current Outpatient Medications:    azithromycin (ZITHROMAX) 250 MG tablet, Take 2 tablets PO on day one, and one tablet PO daily thereafter until completed., Disp: 6 tablet, Rfl: 0   ondansetron (ZOFRAN ODT) 4 MG disintegrating tablet, Take 1 tablet (4 mg total) by mouth every 8 (eight) hours as needed for nausea or vomiting., Disp: 20 tablet, Rfl: 0   methimazole (TAPAZOLE) 10 MG tablet, Take by mouth every other day. (Patient not taking: Reported on 12/06/2019), Disp: , Rfl:    PROPRANOLOL HCL PO, Take by mouth every other day. (Patient not taking: Reported on 12/06/2019), Disp: , Rfl:    sertraline (ZOLOFT) 25 MG tablet, TAKE 1 TABLET BY MOUTH EVERY DAY, Disp: 30 tablet, Rfl: 0  Observations/Objective: Patient is well-developed, well-nourished in no acute distress. Appears ill. Resting comfortably at home.  Head is normocephalic, atraumatic.  No labored breathing.  Speech is clear and coherent with logical content.  Patient is alert and oriented at baseline.    Assessment and Plan: 1. Bacterial upper respiratory infection - azithromycin (ZITHROMAX) 250 MG tablet; Take 2 tablets PO on day one, and one tablet PO daily thereafter until completed.  Dispense: 6 tablet; Refill: 0  2. Nausea - ondansetron (ZOFRAN ODT) 4 MG disintegrating tablet; Take 1 tablet (4 mg total) by mouth every 8 (eight) hours as needed for nausea or vomiting.  Dispense: 20  tablet; Refill: 0  3. Dehydration  - Suspect a secondary bacterial infection following Covid 19. - Will treat with Zpak to cover atypical pneumonia - Zofran for nausea and vomiting - Advised she needs to push fluids as best she can - If unable to tolerate intake today with addition of Zofran, or if she becomes dizzy or lightheaded, she needs to seek emergent care for fluids; she voiced understanding  Follow Up Instructions: I discussed the assessment and treatment plan with the patient. The patient was provided an opportunity to ask questions and all were answered. The patient agreed with the plan and demonstrated an understanding of the instructions.  A copy of instructions were sent to the patient via MyChart.  The patient was advised to call back or seek an in-person evaluation if the symptoms worsen or if the condition fails to improve as anticipated.  Time:  I spent 13 minutes with the patient via telehealth technology discussing the above problems/concerns.    Cassandra Loveless, PA-C

## 2020-09-25 NOTE — ED Triage Notes (Signed)
Pt reports testing positive for COVID on 7/11 and had dental procedure on same day procedure. Since then pt has been having ongoing fatigue and poor PO intake. States that she has migrane behind R eye with blurry vision that started 7/18.

## 2020-12-22 ENCOUNTER — Other Ambulatory Visit: Payer: Self-pay

## 2020-12-22 ENCOUNTER — Encounter (HOSPITAL_BASED_OUTPATIENT_CLINIC_OR_DEPARTMENT_OTHER): Payer: Self-pay

## 2020-12-22 ENCOUNTER — Emergency Department (HOSPITAL_BASED_OUTPATIENT_CLINIC_OR_DEPARTMENT_OTHER)
Admission: EM | Admit: 2020-12-22 | Discharge: 2020-12-22 | Disposition: A | Discharge status: Home / Self Care | Payer: No Typology Code available for payment source | Attending: Emergency Medicine | Admitting: Emergency Medicine

## 2020-12-22 DIAGNOSIS — M791 Myalgia, unspecified site: Secondary | ICD-10-CM | POA: Insufficient documentation

## 2020-12-22 DIAGNOSIS — I10 Essential (primary) hypertension: Secondary | ICD-10-CM | POA: Insufficient documentation

## 2020-12-22 DIAGNOSIS — M549 Dorsalgia, unspecified: Secondary | ICD-10-CM | POA: Insufficient documentation

## 2020-12-22 DIAGNOSIS — M542 Cervicalgia: Secondary | ICD-10-CM | POA: Insufficient documentation

## 2020-12-22 DIAGNOSIS — Z79899 Other long term (current) drug therapy: Secondary | ICD-10-CM | POA: Insufficient documentation

## 2020-12-22 DIAGNOSIS — Y92410 Unspecified street and highway as the place of occurrence of the external cause: Secondary | ICD-10-CM | POA: Insufficient documentation

## 2020-12-22 DIAGNOSIS — M7918 Myalgia, other site: Secondary | ICD-10-CM

## 2020-12-22 MED ORDER — CYCLOBENZAPRINE HCL 10 MG PO TABS: 10.0000 mg | ORAL_TABLET | Freq: Two times a day (BID) | ORAL | 0 refills | Status: AC | PRN

## 2020-12-22 MED ORDER — NAPROXEN 500 MG PO TABS: 500.0000 mg | ORAL_TABLET | Freq: Two times a day (BID) | ORAL | 0 refills | Status: AC

## 2020-12-22 MED ORDER — IBUPROFEN 400 MG PO TABS
400.0000 mg | ORAL_TABLET | Freq: Once | ORAL | Status: AC | PRN
  Administered 2020-12-22: 400 mg via ORAL
  Filled 2020-12-22: qty 1
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
## 2020-12-22 NOTE — Discharge Instructions (Addendum)
Warm compresses for 20 minutes at a time followed by gentle stretching. Naproxen and Flexeril as needed as directed for body aches. Recheck with your doctor for pain not improving after 3-5 days.

## 2021-05-13 ENCOUNTER — Encounter: Payer: Self-pay | Admitting: *Deleted

## 2022-03-25 IMAGING — MG DIGITAL SCREENING BILAT W/ TOMO W/ CAD
8 series · 8 of 24 positions shown · non-contrast
Comparison: Previous exam(s).

CLINICAL DATA: Screening.

EXAM:
DIGITAL SCREENING BILATERAL MAMMOGRAM WITH TOMO AND CAD

[R MLO synth-2D]
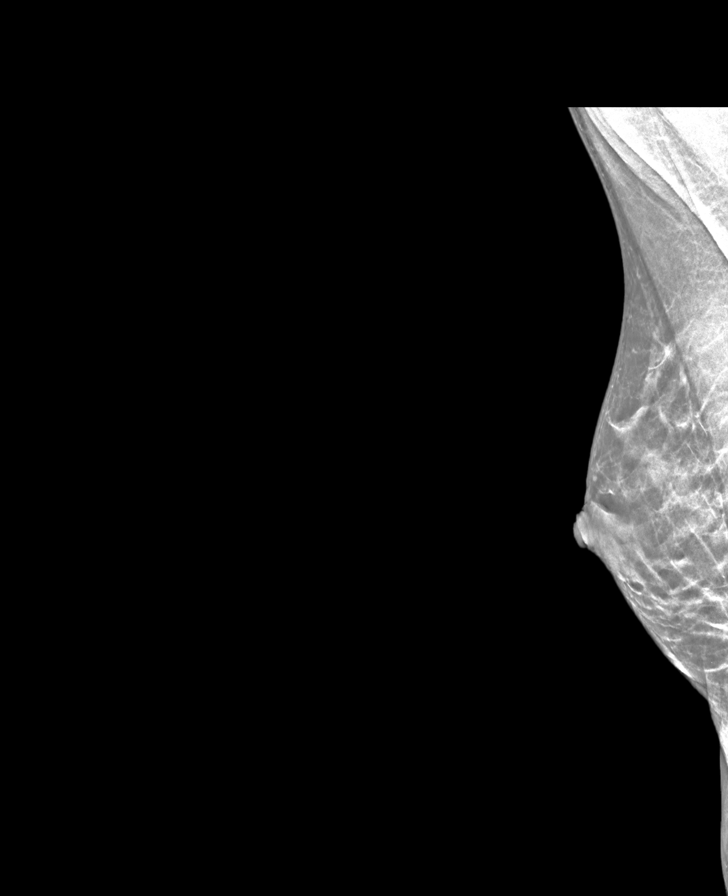

[R CC synth-2D]
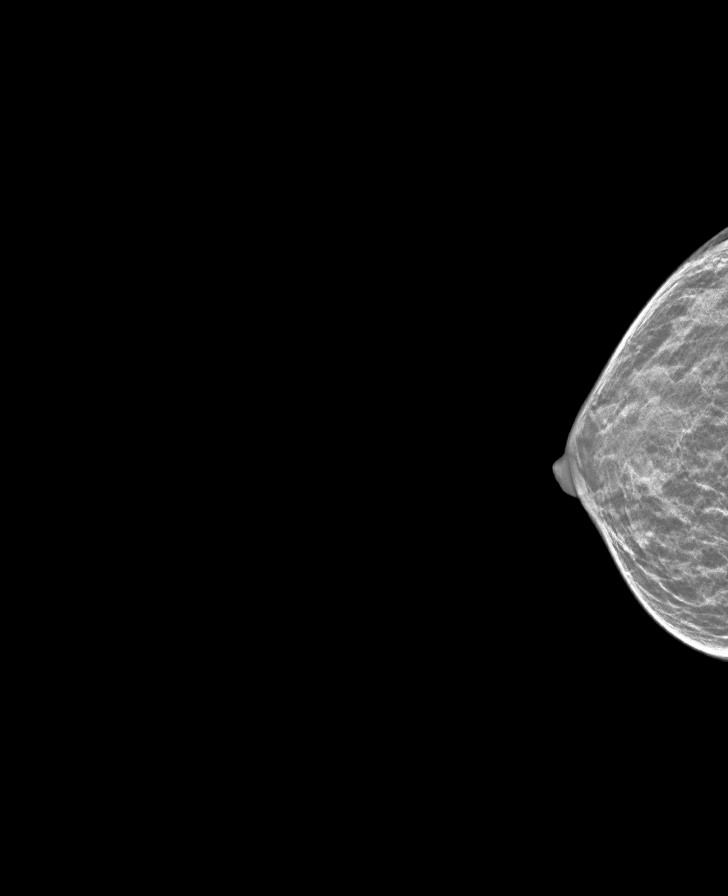

[L CC synth-2D]
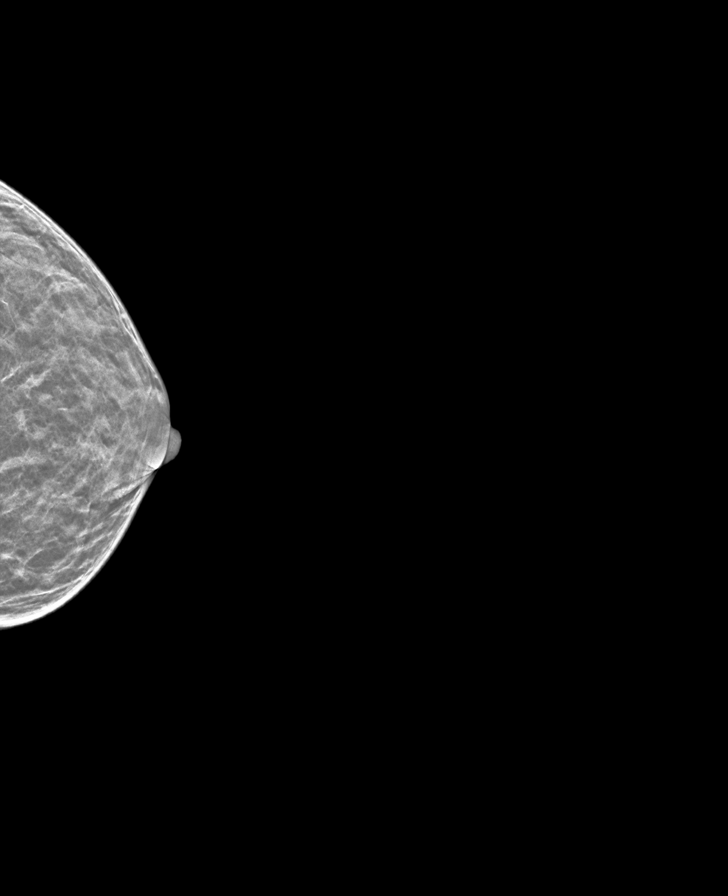

[L MLO synth-2D]
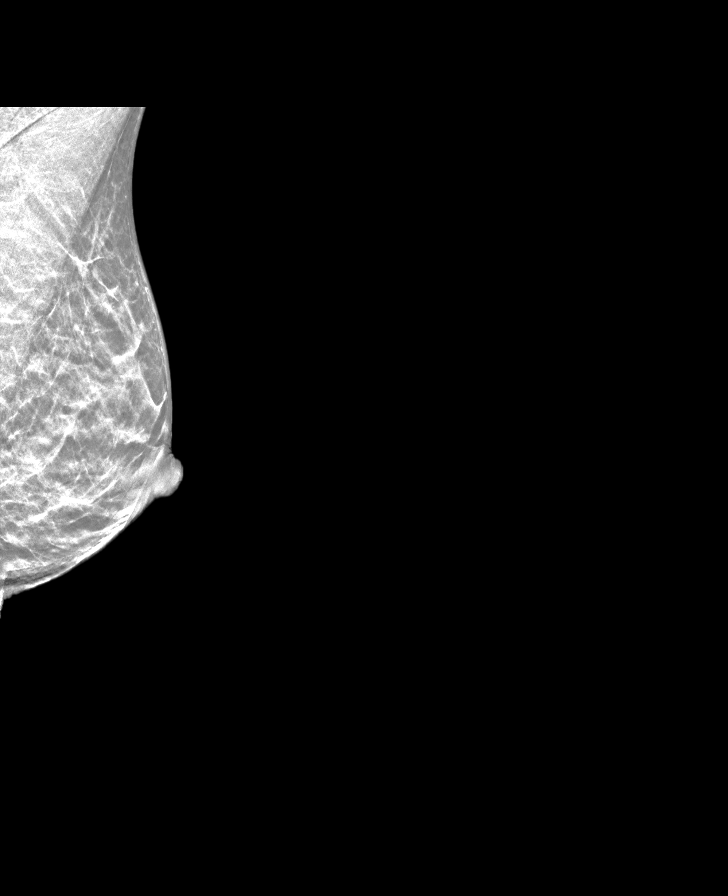

[R CC tomo · tomo slice 13/25.0]
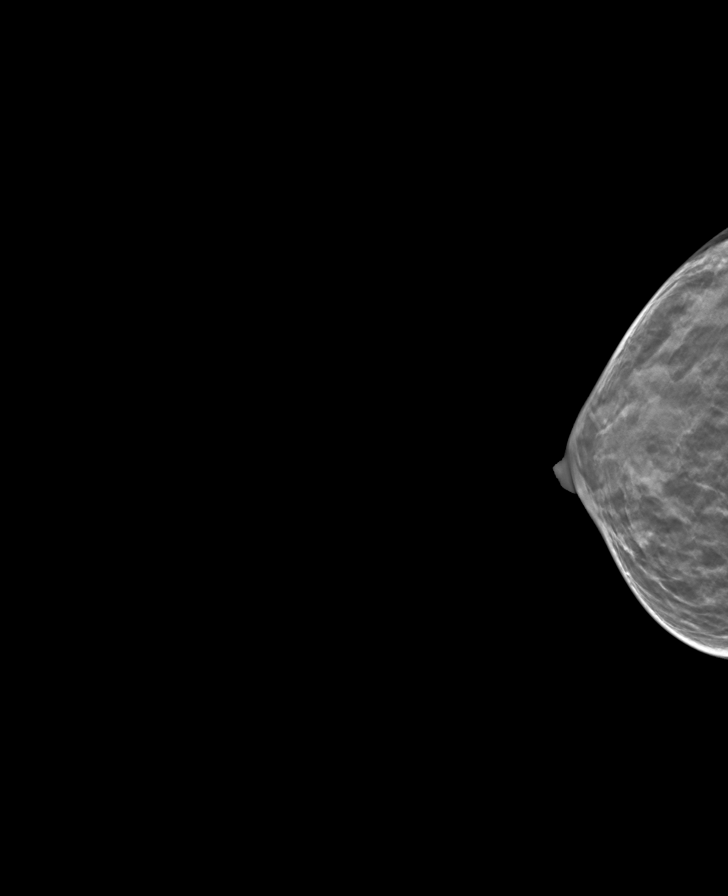

[L MLO tomo · tomo slice 16/31.0]
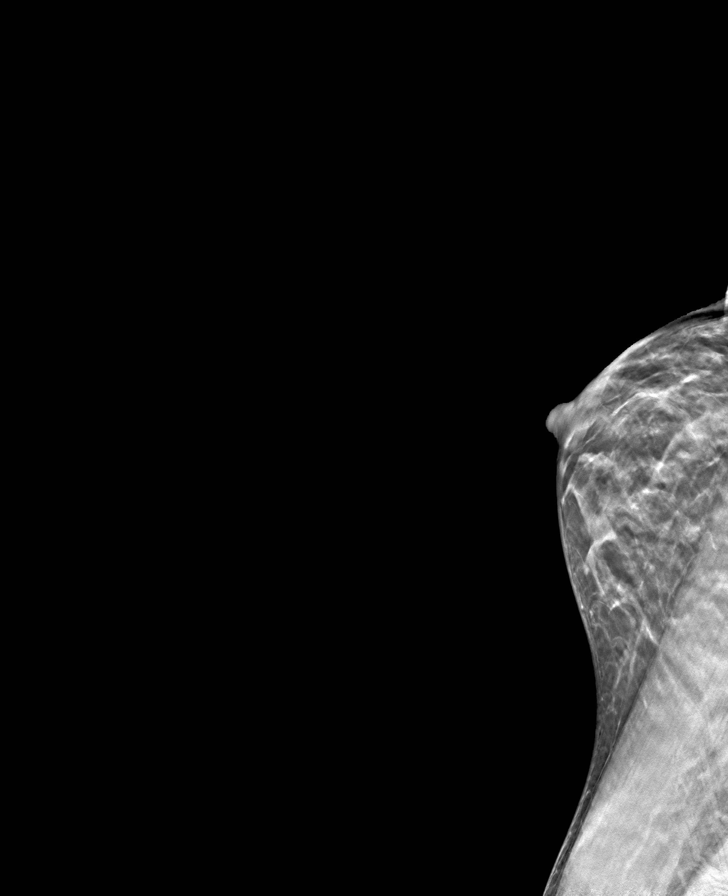

[L CC tomo · tomo slice 13/26.0]
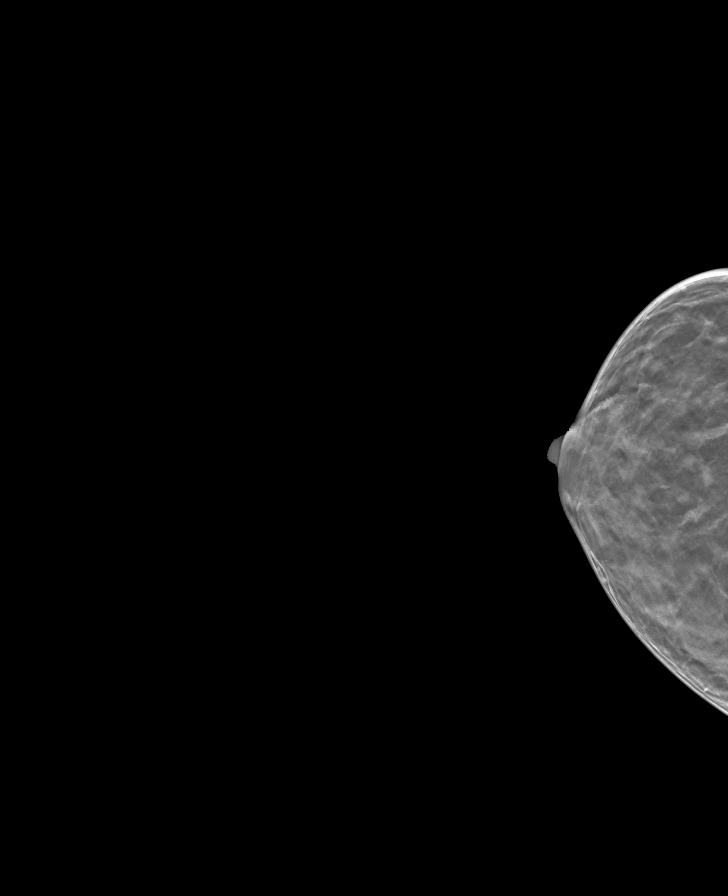

[R MLO tomo · tomo slice 20/39.0]
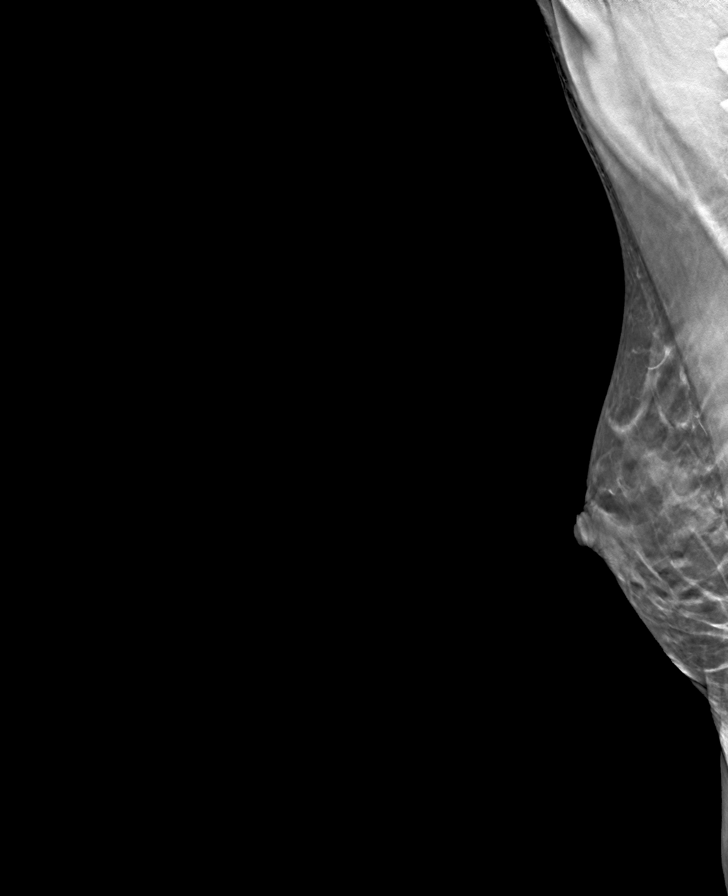

[8 of 24 positions shown; findings below may reference images not displayed]

ACR Breast Density Category c: The breast tissue is heterogeneously
dense, which may obscure small masses.
FINDINGS: There are no findings suspicious for malignancy. Images were
processed with CAD.
IMPRESSION: No mammographic evidence of malignancy. A result letter of this
screening mammogram will be mailed directly to the patient.

RECOMMENDATION:
Screening mammogram in one year. (Code:FT-U-LHB)

BI-RADS CATEGORY  1: Negative.
